# Patient Record
Sex: Male | Born: 2000
Health system: Southern US, Community
[De-identification: ages and names within clinical notes are randomized; demographics above are authoritative.]

---
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
## 2020-05-23 IMAGING — CR DG HAND COMPLETE 3+V*L*
3 series · 3 of 3 positions shown · non-contrast
Comparison: None.

CLINICAL DATA: Lawnmower injury. Laceration third fourth and fifth
digits.

EXAM:
LEFT HAND - COMPLETE 3+ VIEW

[x hand pa left]
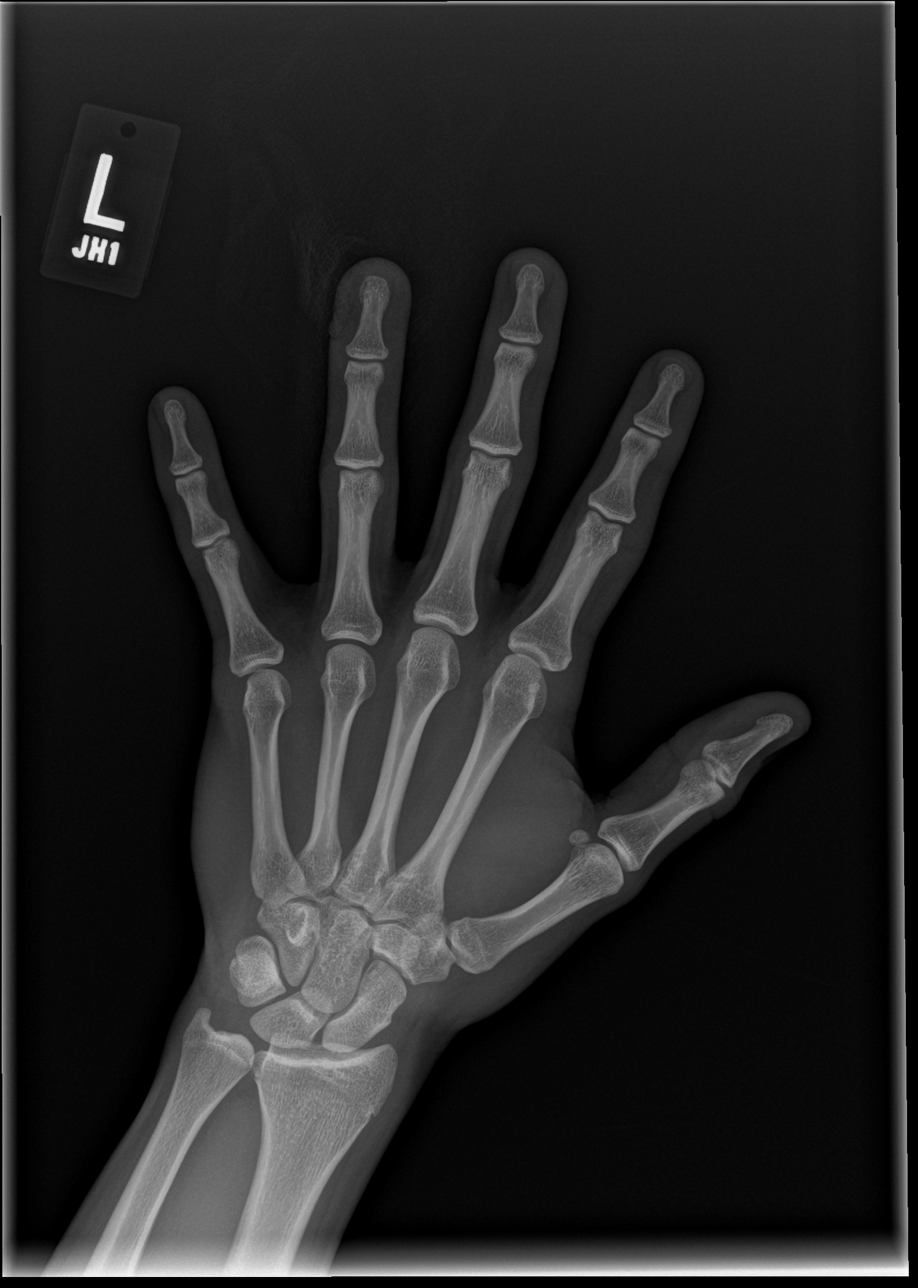

[x hand obl left]
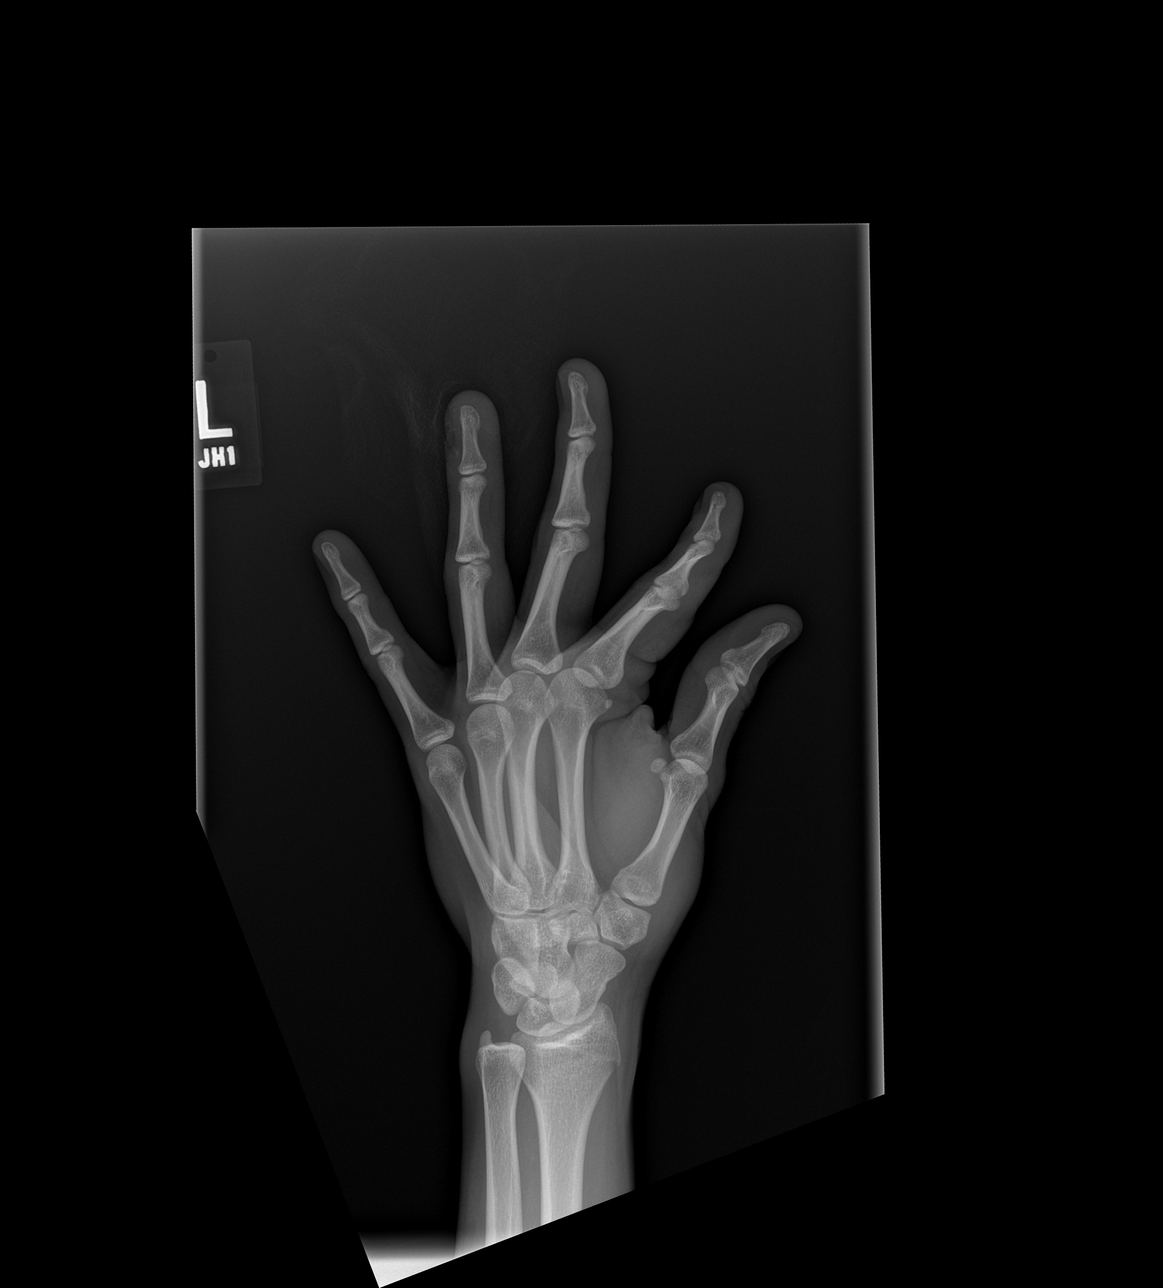

[x hand lat left]
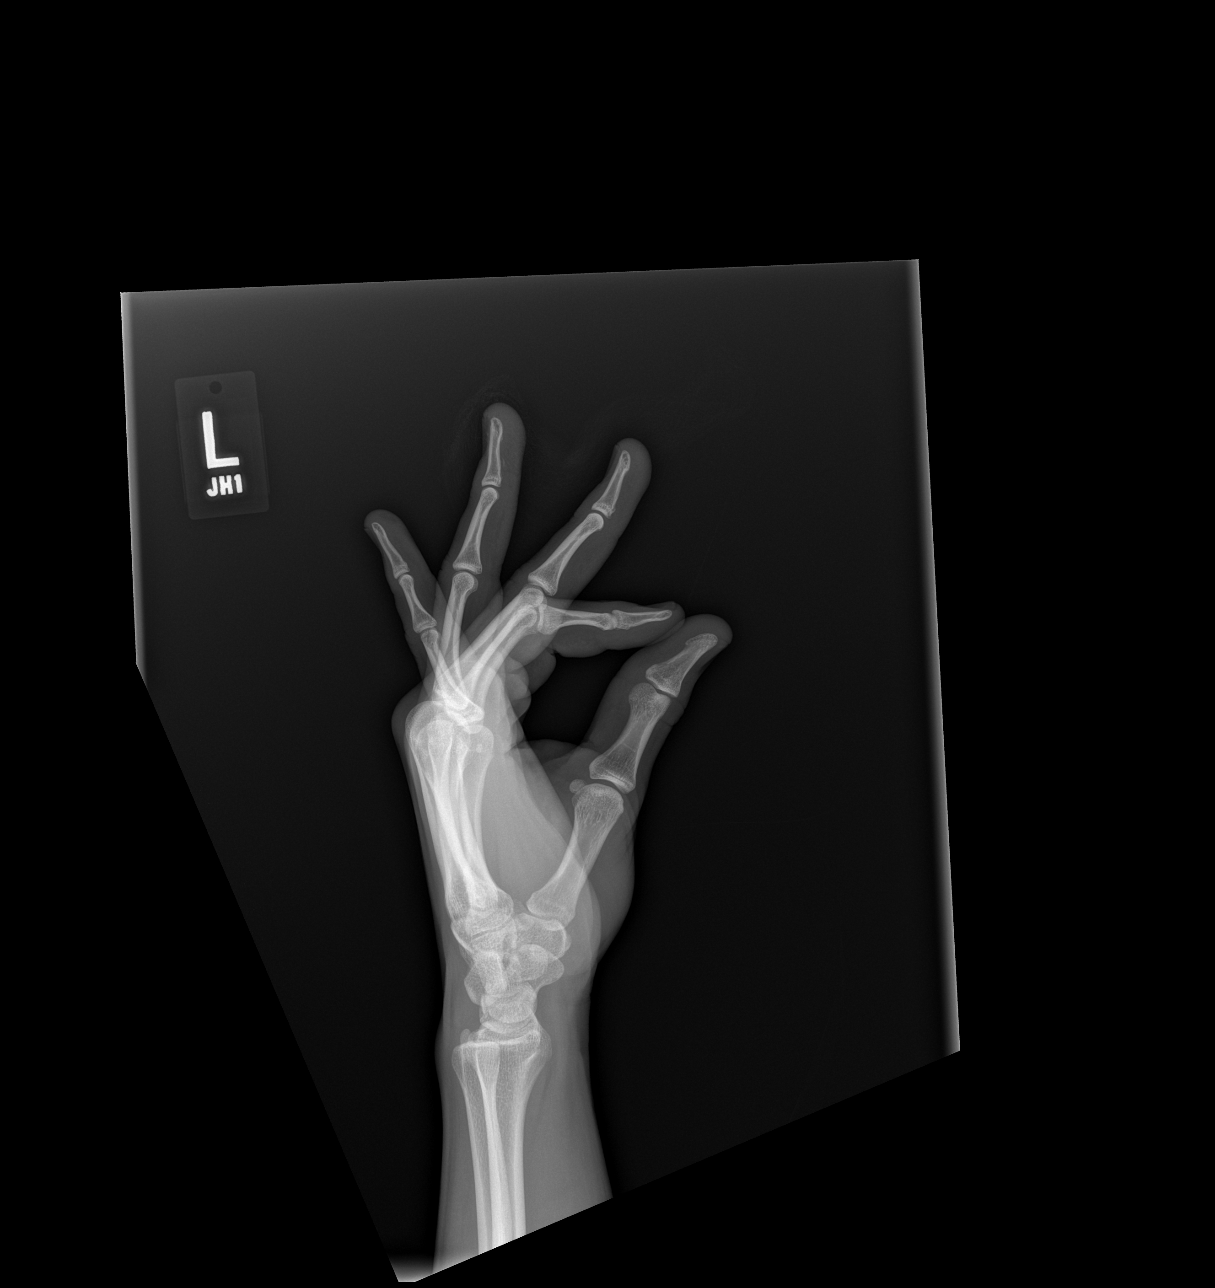

[3 of 3 positions shown; findings below may reference images not displayed]

FINDINGS: Laceration of the fourth distal phalanx. Small linear density in the
soft tissues anterior to the distal phalanx could be a foreign body
or a small avulsed bone fragment.

Otherwise no fracture or arthropathy
IMPRESSION: Laceration distal fourth finger. Possible small laceration versus
foreign body.

## 2020-08-04 DIAGNOSIS — Z13220 Encounter for screening for lipoid disorders: Secondary | ICD-10-CM | POA: Diagnosis not present

## 2021-10-06 DIAGNOSIS — Z005 Encounter for examination of potential donor of organ and tissue: Secondary | ICD-10-CM | POA: Diagnosis not present

## 2022-01-01 DIAGNOSIS — Z523 Bone marrow donor: Secondary | ICD-10-CM | POA: Diagnosis not present

## 2022-03-24 DIAGNOSIS — D2271 Melanocytic nevi of right lower limb, including hip: Secondary | ICD-10-CM | POA: Diagnosis not present

## 2022-03-24 DIAGNOSIS — D224 Melanocytic nevi of scalp and neck: Secondary | ICD-10-CM | POA: Diagnosis not present

## 2022-03-24 DIAGNOSIS — D225 Melanocytic nevi of trunk: Secondary | ICD-10-CM | POA: Diagnosis not present

## 2022-03-24 DIAGNOSIS — Q825 Congenital non-neoplastic nevus: Secondary | ICD-10-CM | POA: Diagnosis not present
